# Patient Record
Sex: Male | Born: 1985 | Race: Black or African American | Hispanic: No | Marital: Single | Smoking: Former smoker
Health system: Southern US, Community
[De-identification: ages and names within clinical notes are randomized; demographics above are authoritative.]

---

## 2012-09-18 ENCOUNTER — Emergency Department (HOSPITAL_COMMUNITY)
Admission: EM | Admit: 2012-09-18 | Discharge: 2012-09-18 | Disposition: A | Discharge status: Home / Self Care | Payer: PRIVATE HEALTH INSURANCE | Attending: Emergency Medicine | Admitting: Emergency Medicine

## 2012-09-18 ENCOUNTER — Emergency Department (HOSPITAL_COMMUNITY): Payer: PRIVATE HEALTH INSURANCE

## 2012-09-18 ENCOUNTER — Encounter (HOSPITAL_COMMUNITY): Payer: Self-pay | Admitting: *Deleted

## 2012-09-18 DIAGNOSIS — Z87891 Personal history of nicotine dependence: Secondary | ICD-10-CM | POA: Insufficient documentation

## 2012-09-18 DIAGNOSIS — N200 Calculus of kidney: Secondary | ICD-10-CM | POA: Insufficient documentation

## 2012-09-18 LAB — URINALYSIS, ROUTINE W REFLEX MICROSCOPIC
Glucose, UA: NEGATIVE mg/dL
Ketones, ur: >80 mg/dL — AB
Protein, ur: 30 mg/dL — AB
pH: 5.5 (ref 5.0–8.0)

## 2012-09-18 LAB — CBC WITH DIFFERENTIAL/PLATELET
Basophils Absolute: 0 10*3/uL (ref 0.0–0.1)
Eosinophils Relative: 0 % (ref 0–5)
HCT: 39.5 % (ref 39.0–52.0)
Hemoglobin: 13.7 g/dL (ref 13.0–17.0)
Lymphocytes Relative: 9 % — ABNORMAL LOW (ref 12–46)
Lymphs Abs: 1.5 10*3/uL (ref 0.7–4.0)
MCV: 84.6 fL (ref 78.0–100.0)
Monocytes Absolute: 1.1 10*3/uL — ABNORMAL HIGH (ref 0.1–1.0)
Monocytes Relative: 7 % (ref 3–12)
Neutro Abs: 13.5 10*3/uL — ABNORMAL HIGH (ref 1.7–7.7)
RBC: 4.67 MIL/uL (ref 4.22–5.81)
RDW: 13.3 % (ref 11.5–15.5)
WBC: 16.2 10*3/uL — ABNORMAL HIGH (ref 4.0–10.5)

## 2012-09-18 LAB — URINE MICROSCOPIC-ADD ON

## 2012-09-18 LAB — POCT I-STAT, CHEM 8
BUN: 13 mg/dL (ref 6–23)
Chloride: 102 mEq/L (ref 96–112)
Creatinine, Ser: 1.3 mg/dL (ref 0.50–1.35)
Sodium: 138 mEq/L (ref 135–145)
TCO2: 25 mmol/L (ref 0–100)

## 2012-09-18 MED ORDER — SODIUM CHLORIDE 0.9 % IV BOLUS (SEPSIS)
1000.0000 mL | Freq: Once | INTRAVENOUS | Status: AC
  Administered 2012-09-18: 1000 mL via INTRAVENOUS

## 2012-09-18 MED ORDER — KETOROLAC TROMETHAMINE 30 MG/ML IJ SOLN: 30.0000 mg | Freq: Once | INTRAMUSCULAR | Status: DC

## 2012-09-18 MED ORDER — TAMSULOSIN HCL 0.4 MG PO CAPS: 0.4000 mg | ORAL_CAPSULE | Freq: Every day | ORAL | Status: DC

## 2012-09-18 MED ORDER — OXYCODONE-ACETAMINOPHEN 5-325 MG PO TABS: 1.0000 | ORAL_TABLET | Freq: Four times a day (QID) | ORAL | Status: DC | PRN

## 2012-09-18 MED ORDER — ONDANSETRON 8 MG PO TBDP: 8.0000 mg | ORAL_TABLET | Freq: Three times a day (TID) | ORAL | Status: DC | PRN

## 2012-09-18 MED ORDER — ONDANSETRON HCL 4 MG/2ML IJ SOLN
4.0000 mg | Freq: Once | INTRAMUSCULAR | Status: AC
  Administered 2012-09-18: 4 mg via INTRAVENOUS
  Filled 2012-09-18: qty 2

## 2012-09-18 MED ORDER — CIPROFLOXACIN HCL 500 MG PO TABS: 500.0000 mg | ORAL_TABLET | Freq: Two times a day (BID) | ORAL | Status: DC

## 2012-09-18 MED ORDER — OXYCODONE-ACETAMINOPHEN 5-325 MG PO TABS
2.0000 | ORAL_TABLET | Freq: Once | ORAL | Status: AC
Start: 2012-09-18 — End: 2012-09-18
  Administered 2012-09-18: 2 via ORAL
  Filled 2012-09-18: qty 2

## 2012-09-18 MED ORDER — MORPHINE SULFATE 4 MG/ML IJ SOLN: 4.0000 mg | Freq: Once | INTRAMUSCULAR | Status: DC

## 2012-09-18 NOTE — ED Notes (Signed)
Pt denies nausea.

## 2012-09-18 NOTE — ED Notes (Signed)
Pt did not answer x 1 

## 2012-09-18 NOTE — ED Notes (Signed)
Pt is here with right lower back pain for last 2 days and points to right flank pain.  Vomited.  Pain with urination

## 2012-09-18 NOTE — ED Provider Notes (Signed)
History     CSN: [id number]  Arrival date & time 09/18/12  1356   First MD Initiated Contact with Patient 09/18/12 1740      Chief Complaint  Patient presents with  . Back Pain    (Consider location/radiation/quality/duration/timing/severity/associated sxs/prior treatment) HPI Comments: Patient presents today with a chief complaint of right flank pain, hematuria, nausea, and vomiting.  He reports that he has had right flank pain intermittently over the past 2 weeks.  However, he reports that today the pain was much worse. At times the pain would become really sharp and then it was ease up.  He also noticed some blood in his urine today.  He also had three episodes of vomiting earlier today.  Denies abdominal pain.  He denies any difficulty urinating.  Denies dysuria or urinary urgency.   Denies fever or chills.  He denies prior history of kidney stones.  The history is provided by the patient.    History reviewed. No pertinent past medical history.  History reviewed. No pertinent past surgical history.  No family history on file.  History  Substance Use Topics  . Smoking status: Former Games developer  . Smokeless tobacco: Not on file  . Alcohol Use: No      Review of Systems  Constitutional: Negative for fever and chills.  Gastrointestinal: Positive for nausea and vomiting. Negative for abdominal pain, diarrhea and constipation.  Genitourinary: Positive for hematuria and flank pain. Negative for dysuria, urgency, frequency, decreased urine volume, scrotal swelling, difficulty urinating and testicular pain.    Allergies  Review of patient's allergies indicates no known allergies.  Home Medications   Current Outpatient Rx  Name  Route  Sig  Dispense  Refill  . ACETAMINOPHEN 500 MG PO TABS   Oral   Take 1,000 mg by mouth every 6 (six) hours as needed. For pain         . IBUPROFEN 200 MG PO TABS   Oral   Take 200 mg by mouth every 6 (six) hours as needed. For pain          BP 141/78  Pulse 69  Temp 98.8 F (37.1 C) (Oral)  Resp 18  SpO2 98%  Physical Exam  Nursing note and vitals reviewed. Constitutional: He appears well-developed and well-nourished. No distress.  HENT:  Head: Normocephalic and atraumatic.  Mouth/Throat: Oropharynx is clear and moist.  Neck: Neck supple.  Cardiovascular: Normal rate, regular rhythm and normal heart sounds.   Pulmonary/Chest: Effort normal and breath sounds normal.  Abdominal: Soft. Bowel sounds are normal. He exhibits no distension and no mass. There is no tenderness. There is no rebound and no guarding.       Right CVA tenderness  Neurological: He is alert.  Skin: Skin is warm and dry. He is not diaphoretic.  Psychiatric: He has a normal mood and affect.    ED Course  Procedures (including critical care time)  Labs Reviewed  URINALYSIS, ROUTINE W REFLEX MICROSCOPIC - Abnormal; Notable for the following:    APPearance HAZY (*)     Hgb urine dipstick LARGE (*)     Bilirubin Urine SMALL (*)     Ketones, ur >80 (*)     Protein, ur 30 (*)     Leukocytes, UA SMALL (*)     All other components within normal limits  POCT I-STAT, CHEM 8 - Abnormal; Notable for the following:    Calcium, Ion 1.28 (*)     All other components within  normal limits  URINE MICROSCOPIC-ADD ON - Abnormal; Notable for the following:    Squamous Epithelial / LPF FEW (*)     Bacteria, UA FEW (*)     Casts HYALINE CASTS (*)     All other components within normal limits  URINE CULTURE  CBC WITH DIFFERENTIAL   Ct Abdomen Pelvis Wo Contrast  09/18/2012  *RADIOLOGY REPORT*  Clinical Data: Right flank pain.  Nausea and vomiting.   Hematuria.  CT ABDOMEN AND PELVIS WITHOUT CONTRAST  Technique:  Multidetector CT imaging of the abdomen and pelvis was performed following the standard protocol without intravenous contrast.  Comparison: None.  Findings: The lung bases are clear without focal nodule, mass, or airspace disease.  Heart size is  normal.  No significant pleural or pericardial effusion is present.  The liver and spleen are within normal limits.  The stomach, duodenum, and pancreas are unremarkable.  The common bile duct and gallbladder are normal.  The adrenal glands are normal bilaterally. Moderate to right-sided hydronephrosis is present.  A punctate nonobstructing stone is evident at the upper pole of the right kidney.  The right ureter is dilated to the anatomic pelvis where obstructing 4.2 mm stone is present.  Punctate nonobstructing stone is seen anteriorly in the midportion of the left kidney.  The left ureter is normal.  The urinary bladder is mostly collapsed.  The rectosigmoid colon is within normal limits.  The remainder of the colon is unremarkable.  The appendix is visualized and within normal limits.  The small bowel is unremarkable.  No significant adenopathy or free fluid is present.  The bone windows are unremarkable.  IMPRESSION:  1.  Obstructing 4.2 mm stone in the distal right ureter with moderate right-sided hydronephrosis. 2.  At least one additional punctate nonobstructing stone is present in the right kidney. 3.  Punctate nonobstructing stone in the left kidney.   Original Report Authenticated By: Marin Roberts, M.D.      No diagnosis found.  9:31 PM Reassessed patient.  He reports that he is not having any pain at this time.  Patient able to tolerate po liquids.  No vomiting.    MDM  Pt has been diagnosed with a Kidney Stone via CT.  Serum creatine WNL and vital signs stable.    Pain and nausea controlled prior to discharge.  Pt will be dc home with Percocet, Zofran,  And Flomax.  Patient has been advised to follow up with Urology.   Return precautions discussed with the patient.          Pascal Lux [place], PA-C 09/19/12 1143

## 2012-09-18 NOTE — ED Notes (Signed)
Pt states lower right back pain started Monday; pt states was very light at first and ibuprofen would help; pt states today pain became worse; pt not sure if injured back or not--states was lifting boxes at work on Monday and felt like he "tweaked" his back

## 2012-09-18 NOTE — ED Notes (Signed)
Pt returned from CT °

## 2012-09-18 NOTE — ED Notes (Signed)
Pt called to room No answer
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
## 2012-09-19 NOTE — ED Provider Notes (Signed)
Medical screening examination/treatment/procedure(s) were performed by non-physician practitioner and as supervising physician I was immediately available for consultation/collaboration.   David H Yao, MD 09/19/12 1501 

## 2012-09-20 LAB — URINE CULTURE

## 2015-05-15 ENCOUNTER — Emergency Department (HOSPITAL_COMMUNITY): Payer: PRIVATE HEALTH INSURANCE

## 2015-05-15 ENCOUNTER — Emergency Department (HOSPITAL_COMMUNITY)
Admission: EM | Admit: 2015-05-15 | Discharge: 2015-05-15 | Disposition: A | Discharge status: Home / Self Care | Payer: Self-pay | Attending: Emergency Medicine | Admitting: Emergency Medicine

## 2015-05-15 ENCOUNTER — Emergency Department (HOSPITAL_COMMUNITY): Payer: Self-pay

## 2015-05-15 ENCOUNTER — Encounter (HOSPITAL_COMMUNITY): Payer: Self-pay | Admitting: Emergency Medicine

## 2015-05-15 DIAGNOSIS — S29092A Other injury of muscle and tendon of back wall of thorax, initial encounter: Secondary | ICD-10-CM | POA: Insufficient documentation

## 2015-05-15 DIAGNOSIS — S0990XA Unspecified injury of head, initial encounter: Secondary | ICD-10-CM | POA: Insufficient documentation

## 2015-05-15 DIAGNOSIS — Y92410 Unspecified street and highway as the place of occurrence of the external cause: Secondary | ICD-10-CM | POA: Insufficient documentation

## 2015-05-15 DIAGNOSIS — Y998 Other external cause status: Secondary | ICD-10-CM | POA: Insufficient documentation

## 2015-05-15 DIAGNOSIS — Z87891 Personal history of nicotine dependence: Secondary | ICD-10-CM | POA: Insufficient documentation

## 2015-05-15 DIAGNOSIS — S161XXA Strain of muscle, fascia and tendon at neck level, initial encounter: Secondary | ICD-10-CM | POA: Insufficient documentation

## 2015-05-15 DIAGNOSIS — S39012A Strain of muscle, fascia and tendon of lower back, initial encounter: Secondary | ICD-10-CM | POA: Insufficient documentation

## 2015-05-15 DIAGNOSIS — Y9389 Activity, other specified: Secondary | ICD-10-CM | POA: Insufficient documentation

## 2015-05-15 MED ORDER — KETOROLAC TROMETHAMINE 60 MG/2ML IM SOLN
60.0000 mg | Freq: Once | INTRAMUSCULAR | Status: AC
  Administered 2015-05-15: 60 mg via INTRAMUSCULAR
  Filled 2015-05-15: qty 2

## 2015-05-15 MED ORDER — HYDROCODONE-ACETAMINOPHEN 5-325 MG PO TABS
2.0000 | ORAL_TABLET | Freq: Once | ORAL | Status: AC
  Administered 2015-05-15: 2 via ORAL
  Filled 2015-05-15: qty 2

## 2015-05-15 MED ORDER — NAPROXEN 500 MG PO TABS: 500.0000 mg | ORAL_TABLET | Freq: Two times a day (BID) | ORAL | Status: AC

## 2015-05-15 MED ORDER — METHOCARBAMOL 500 MG PO TABS: 500.0000 mg | ORAL_TABLET | Freq: Two times a day (BID) | ORAL | Status: AC

## 2015-05-15 MED ORDER — METHOCARBAMOL 500 MG PO TABS
500.0000 mg | ORAL_TABLET | Freq: Once | ORAL | Status: AC
  Administered 2015-05-15: 500 mg via ORAL
  Filled 2015-05-15: qty 1
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
## 2015-05-15 NOTE — ED Notes (Signed)
PA Humes at bedside.  
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
